# Patient Record
Sex: Female | Born: 1975 | Hispanic: Yes | Marital: Single
Health system: Southern US, Community
[De-identification: ages and names within clinical notes are randomized; demographics above are authoritative.]

## PROBLEM LIST (undated history)

## (undated) DIAGNOSIS — Z13220 Encounter for screening for lipoid disorders: Secondary | ICD-10-CM

## (undated) DIAGNOSIS — E0500 Thyrotoxicosis with diffuse goiter without thyrotoxic crisis or storm: Secondary | ICD-10-CM

## (undated) HISTORY — PX: BREAST BIOPSY: SHX20

---

## 2017-04-30 ENCOUNTER — Telehealth (HOSPITAL_COMMUNITY): Payer: Self-pay | Admitting: Family Medicine

## 2017-05-06 NOTE — Telephone Encounter (Signed)
Called and spoke to Ginger to inform her that I had reached out a few times to get patient scheduled for an echocardiogram per Dr. Johnny Bridge and I had not received a call back.

## 2018-05-20 ENCOUNTER — Other Ambulatory Visit: Payer: Self-pay | Admitting: Family Medicine

## 2018-05-20 ENCOUNTER — Other Ambulatory Visit: Payer: Self-pay | Admitting: Physician Assistant

## 2018-05-20 DIAGNOSIS — Z1231 Encounter for screening mammogram for malignant neoplasm of breast: Secondary | ICD-10-CM

## 2018-05-24 ENCOUNTER — Ambulatory Visit
Admission: RE | Admit: 2018-05-24 | Discharge: 2018-05-24 | Disposition: A | Discharge status: Home / Self Care | Payer: BLUE CROSS/BLUE SHIELD | Source: Ambulatory Visit | Attending: Family Medicine | Admitting: Family Medicine

## 2018-05-24 DIAGNOSIS — Z1231 Encounter for screening mammogram for malignant neoplasm of breast: Secondary | ICD-10-CM

## 2018-05-25 NOTE — Addendum Note (Signed)
Addended by: Morrell Riddle on: 05/25/2018 11:50 AM     Modules accepted: Orders      Electronically signed by Benny Lennert, PA at 05/25/2018 11:50 AM EDT

## 2018-05-25 NOTE — Progress Notes (Signed)
Formatting of this note is different from the original.  Images from the original note were not included.  Andrea Watkins    MRN: [id number] DOB: 21-Jan-1976    PCP: Benny Lennert, PA    Assessment and Plan :   1. Annual physical exam (Primary)  -     POCT CBC W/DIFF  -     Comprehensive Metabolic Panel; Future; Expected date: 06/01/2018  -     Iron and Total Iron-Binding Capacity (TIBC); Future; Expected date: 06/01/2018  -     Lipid Panel With LDL/HDL Ratio; Future; Expected date: 06/01/2018  -     POCT urinalysis dipstick  -     HIV 1/0/2 Ag/Ab 4th Generation Preliminary Test w/Rflx; Future  -     Chlamydia/GC Amplification Urine; Future  -     RPR; Future  -     RPR  -     Chlamydia/GC Amplification Urine  -     HIV 1/0/2 Ag/Ab 4th Generation Preliminary Test w/Rflx  -     Lipid Panel With LDL/HDL Ratio  -     Iron and Total Iron-Binding Capacity (TIBC)  -     Comprehensive Metabolic Panel  2. Leg cramping - trial of stretches and increase water while waiting on lab resullts  -     Comprehensive Metabolic Panel; Future; Expected date: 06/01/2018  -     Iron and Total Iron-Binding Capacity (TIBC); Future; Expected date: 06/01/2018  -     Iron and Total Iron-Binding Capacity (TIBC)  -     Comprehensive Metabolic Panel  3. Pica- check labs  -     POCT CBC W/DIFF  -     Iron and Total Iron-Binding Capacity (TIBC); Future; Expected date: 06/01/2018  -     Iron and Total Iron-Binding Capacity (TIBC)  4. Elevated LDL cholesterol level -  Check labs  -     Lipid Panel With LDL/HDL Ratio; Future; Expected date: 06/01/2018  -     Lipid Panel With LDL/HDL Ratio  5. MVP (mitral valve prolapse)  -     Ambulatory referral to Cardiology    Pt will make an appt with physcologist for ADD evaluation and evaluation of mental health issues.    Follow up if symptoms worsen or fail to improve, for CPE.      Patient's Medications        Accurate as of May 25, 2018  8:54 AM. Reflects encounter med changes as of last refresh          Continued Medications      Instructions   ESTARYLLA 0.25-35 mg-mcg per tablet  Generic drug:  norgestimate-ethinyl estradiol   1 tablet, Oral, Daily      Discontinued Medications    cetirizine-pseudoephedrine 5-120 MG per tablet  Commonly known as:  ZYRTEC-D  Stopped by:  Benny Lennert, PA    polyethylene glycol powder  Commonly known as:  MIRALAX,GAVILAX,CLEARLAX  Stopped by:  Benny Lennert, PA        Risks, benefits, and alternatives of the medications and treatment plan prescribed today were discussed, and patient expressed understanding. Plan follow-up as discussed or as needed if any worsening symptoms or change in condition.  Patient voiced understanding of the treatment plan and agreed to attempt to comply.    See after visit summary for patient specific instructions.    Subjective:     Chief Complaint   Patient presents with   ? Annual Exam  fasting     Pt presents to clinic for a CPE.    Cervical Cancer Screening:        Last pap -  04/2017 - normal       History of previous abnormal pap - no  Breast Cancer Screening:        Last mammogram yesterday       SBE none  Colorectal Cancer Screening: last year - normal    HIV Screening: 2018  STI Screening: 2018  Last dental exam: once a year  Last vision exam: never    Typical meals for patient: 3 meals - both home cooked/out to eat  Typical beverage choices: coffee  Exercises: 3 times per week for 1 hour  Sleeps: 5 hrs per night and sleeping well except when she has a lot going on in her life    Leg cramping and feet cramping over the last several weeks.  Se has also been craving ice - she has h/o anemia and iron def in the past.    She is also having some problems with her attention.  Does not affect her at work.  She finds that she has had trouble at home with starting multiple and finishing no tasks for a long time.  She has never been evaluated for attention problems.  She does also admit to have mental stressors in her life.    Continues to have some  abd cramping and bloating - she had a CT which showed some constipation that I reviewed while she was in the office. I reviewed her colonoscopy and endoscopy which were both normal.    Pt was obtained by patient.    Vaccinations:  Immunization History   Administered Date(s) Administered   ? INFLUENZA QUADRIVALENT (AGE 59 MONTHS & OLDER) 07/08/2016   ? Influenza Quad 3+ yrs 05/17/2018   ? Tdap 07/08/2016     Patient Active Problem List   Diagnosis   ? MVP (mitral valve prolapse)     Patient Care Team:  Benny Lennert, PA as PCP - General (Physician Assistant)  Marijo File, PA as Physician Assistant (Gastroenterology)  Konrad Penta, MD as Consulting Physician (Gastroenterology)    Review of Systems   Constitutional: Negative.    HENT: Negative.    Eyes: Negative.    Respiratory: Negative.    Cardiovascular: Negative.    Gastrointestinal: Negative.    Endocrine: Negative.    Genitourinary: Negative.    Musculoskeletal: Negative.         Leg cramping and feet cramping   Skin: Negative.    Allergic/Immunologic: Negative.    Neurological: Negative.    Hematological: Negative.    Psychiatric/Behavioral: Negative.          Patient's Medications        Accurate as of May 25, 2018  8:54 AM. 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 No pertinent surgical history.    Family History   Problem Relation Age of Onset   ? Hypertension Mother    ? Diabetes Mother    ? Peripheral vascular disease Mother    ? Stroke Father    ? Hypertension Father    ? Diabetes Sister    ? Thyroid disease Sister    ? Hypertension Sister    ? Asthma Sister    ? Splenomegaly Sister    ? Cirrhosis Sister    ? Alcohol abuse Sister    ? Substance abuse Sister    ? No Known Problems Brother    ? Cirrhosis Brother    ? Diabetes Brother    ? Alcohol abuse Brother      Objective:   BP 122/80   Pulse 83   Resp 16   Ht 4\' 11"  (1.499 m)   Wt 124 lb 6.4 oz (56.4 kg)   LMP 05/23/2018   SpO2 99%   Breastfeeding? No   BMI 25.13 kg/m     Wt Readings from Last 3 Encounters:   05/25/18 124 lb 6.4 oz (56.4 kg)   04/30/17 120 lb 12.8 oz (54.8 kg)   11/21/16 122 lb (55.3 kg)     No exam data present    Physical Exam  Vitals signs and nursing note reviewed. Exam conducted with a chaperone present.   Constitutional:       Appearance: Normal appearance.   HENT:      Head: Normocephalic and atraumatic.      Right Ear: Tympanic membrane, ear canal and external ear normal. There is no impacted cerumen.      Left Ear: Tympanic membrane, ear canal and external ear normal. There is no impacted cerumen.      Nose: Nose normal. No congestion or rhinorrhea.      Mouth/Throat:      Mouth: Mucous membranes are moist.      Pharynx: Oropharynx is clear. No oropharyngeal exudate or posterior oropharyngeal erythema.   Eyes:      Extraocular Movements: Extraocular movements intact.      Conjunctiva/sclera: Conjunctivae normal.      Pupils: Pupils are equal, round, and reactive to light.   Neck:      Musculoskeletal: Normal range of motion and neck supple.       Thyroid: No thyroid mass or thyromegaly.      Trachea: Trachea and phonation normal.   Cardiovascular:      Rate and Rhythm: Normal rate and regular rhythm.      Heart sounds: Normal heart sounds. No murmur.   Pulmonary:      Effort: Pulmonary effort is normal.      Breath sounds: Normal breath sounds.   Abdominal:      General: Abdomen is flat.  Bowel sounds are normal.      Palpations: Abdomen is soft.   Musculoskeletal: Normal range of motion.   Lymphadenopathy:      Cervical:      Right cervical: No superficial or posterior cervical adenopathy.     Left cervical: No superficial or posterior cervical adenopathy.   Skin:     General: Skin is warm and dry.      Capillary Refill: Capillary refill takes less than 2 seconds.   Neurological:      General: No focal deficit present.      Mental Status: She is alert and oriented to person, place, and time.      Gait: Gait normal.   Psychiatric:         Mood and Affect: Mood normal.         Behavior: Behavior normal.         Thought Content: Thought content normal.         Judgement: Judgment normal.     Benny Lennert PA-C     Benny Lennert, PA    [facility]  [facility]      Electronically signed by Benny Lennert, PA at 05/25/2018  9:00 AM EDT

## 2018-05-25 NOTE — Addendum Note (Signed)
Addended by: Avel Peace on: 05/25/2018 11:56 AM     Modules accepted: Orders      Electronically signed by Avel Peace at 05/25/2018 11:56 AM EDT

## 2019-02-24 IMAGING — MG DIGITAL SCREENING BILATERAL MAMMOGRAM WITH TOMO AND CAD
8 series · 8 of 24 positions shown · non-contrast
Comparison: Previous exam(s).

CLINICAL DATA: Screening.

EXAM:
DIGITAL SCREENING BILATERAL MAMMOGRAM WITH TOMO AND CAD

[R MLO synth-2D]
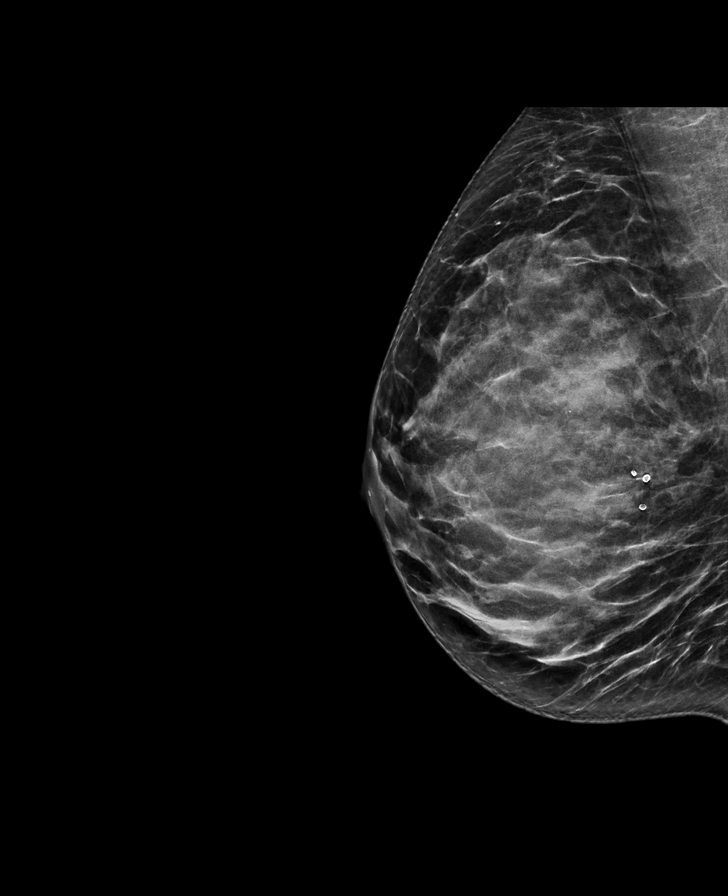

[L MLO synth-2D]
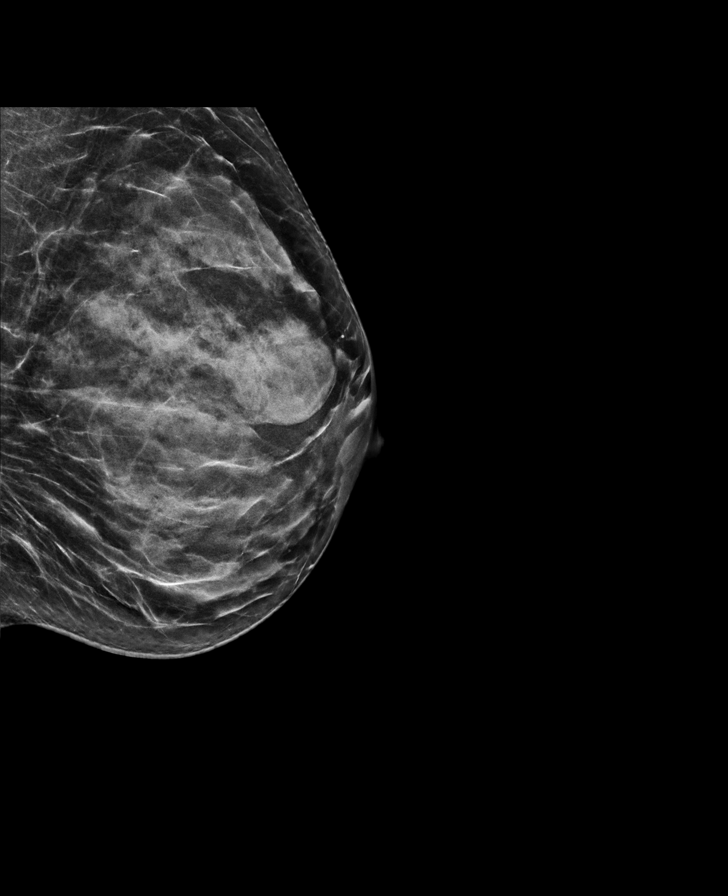

[R CC synth-2D]
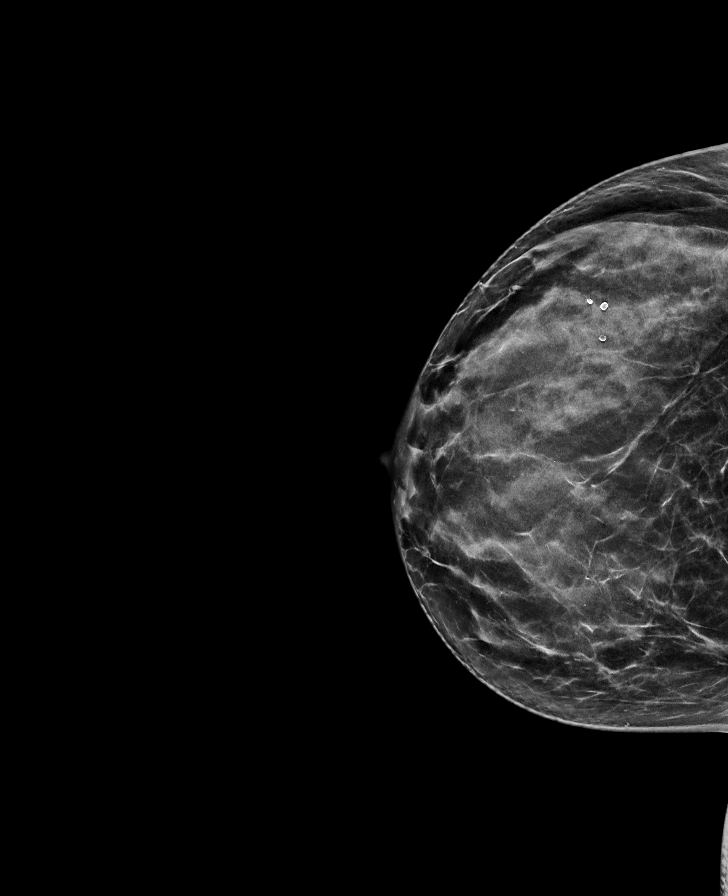

[L CC synth-2D]
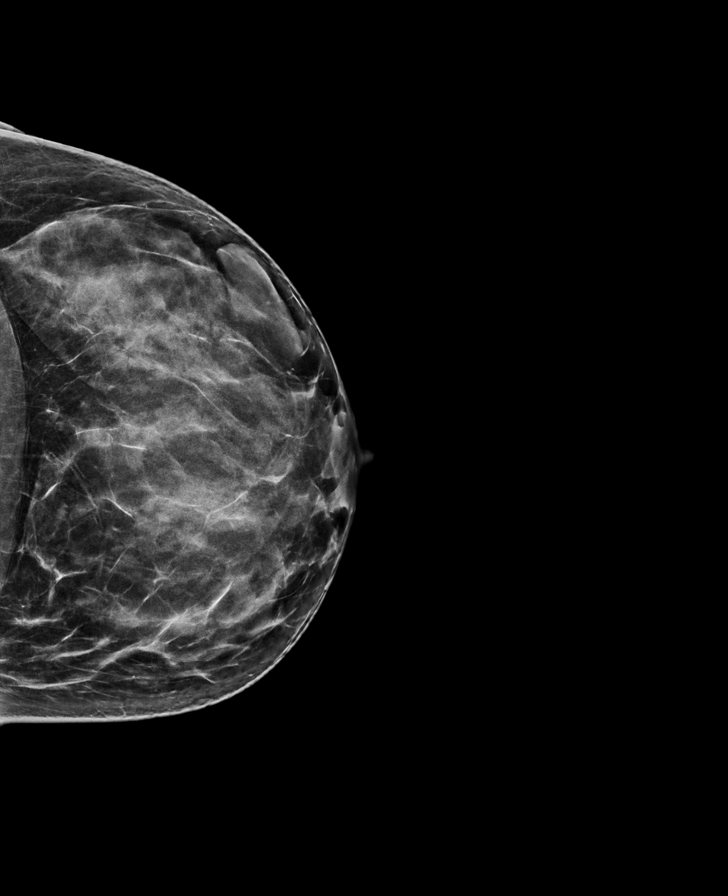

[R MLO tomo · tomo slice 31/60.0]
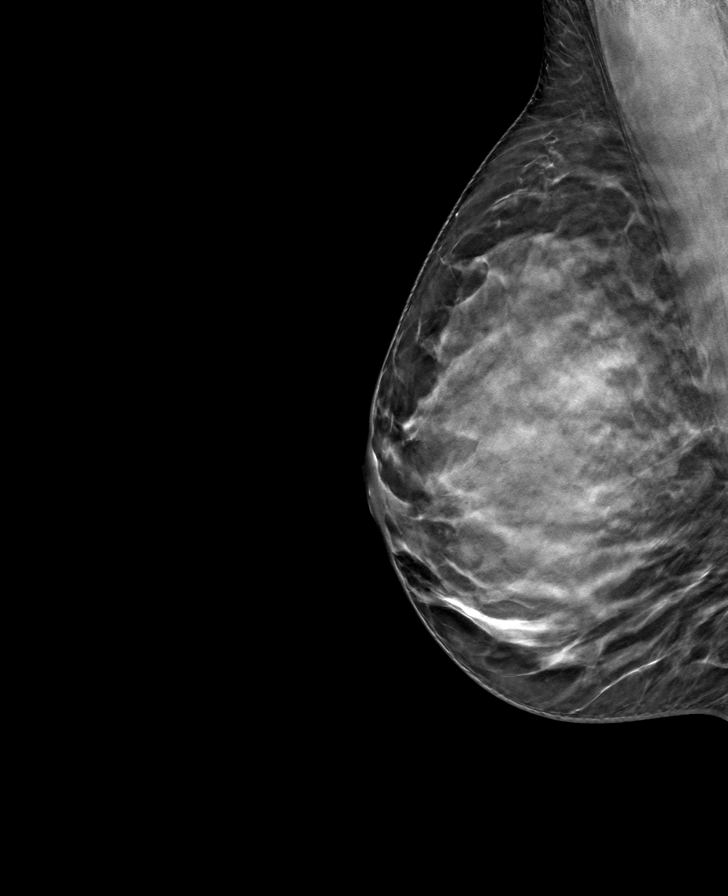

[R CC tomo · tomo slice 32/63.0]
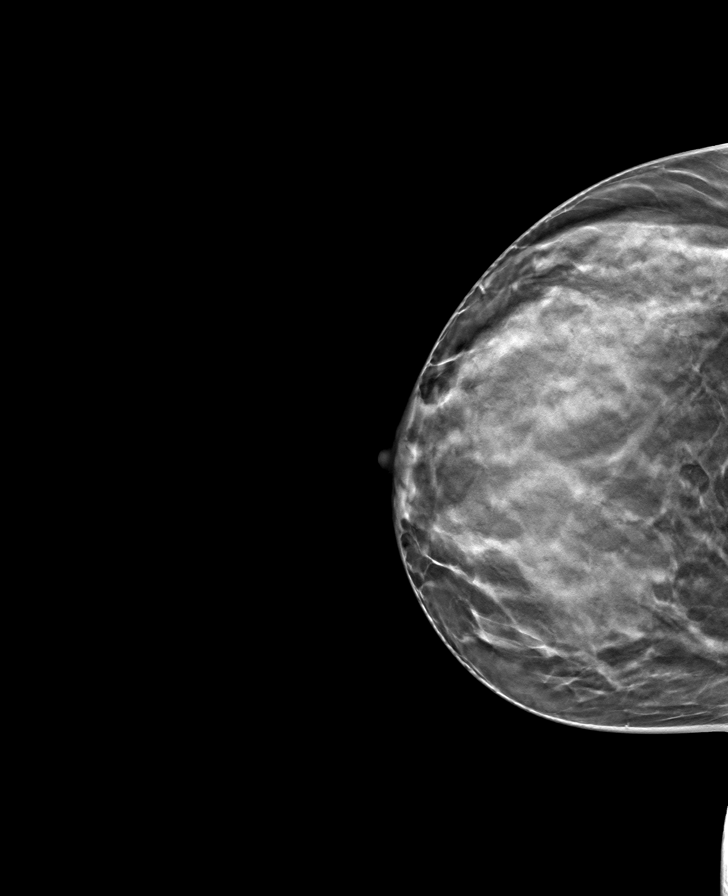

[L CC tomo · tomo slice 33/65.0]
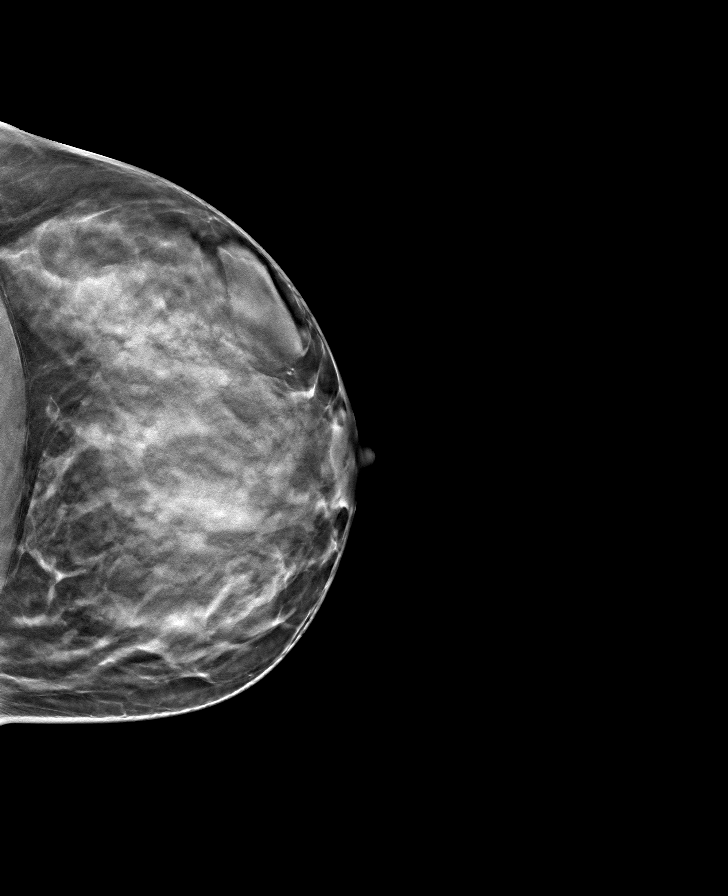

[L MLO tomo · tomo slice 31/60.0]
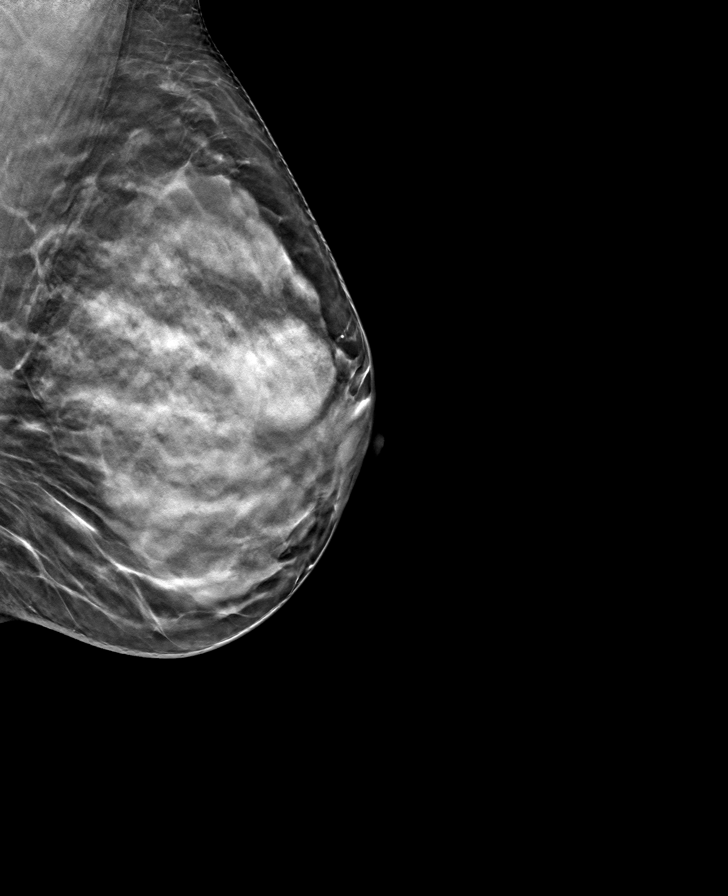

[8 of 24 positions shown; findings below may reference images not displayed]

ACR Breast Density Category d: The breast tissue is extremely dense,
which lowers the sensitivity of mammography.
FINDINGS: There are no findings suspicious for malignancy. Images were
processed with CAD.
IMPRESSION: No mammographic evidence of malignancy. A result letter of this
screening mammogram will be mailed directly to the patient.

RECOMMENDATION:
Screening mammogram in one year. (Code:RA-I-AVB)

BI-RADS CATEGORY  1: Negative.

## 2023-01-14 ENCOUNTER — Ambulatory Visit: Admit: 2023-01-14 | Discharge: 2023-01-14 | Payer: PRIVATE HEALTH INSURANCE | Attending: Family

## 2023-01-14 ENCOUNTER — Ambulatory Visit: Admit: 2023-01-14 | Payer: PRIVATE HEALTH INSURANCE

## 2023-01-14 DIAGNOSIS — S8992XA Unspecified injury of left lower leg, initial encounter: Secondary | ICD-10-CM

## 2023-01-14 DIAGNOSIS — S8002XA Contusion of left knee, initial encounter: Secondary | ICD-10-CM

## 2023-01-14 NOTE — Progress Notes (Signed)
Subjective:      Patient ID: Andrea Watkins     Chief Complaint Knee Injury (Happened yesterday. Hit knee on corner of desk.)       Time Patient seen by Provider: 1:47 PM  DOI 01/13/23  [facility]  Job: business patient specialist      HPI The patient is a 47 y.o. female presents with complaints of pain to the anterior aspect of the left knee over the patella and tibial plateau.  Patient states that she injured her knee yesterday while at work.  Patient states that she was seated in a chair and was scooting forward and hit her knee on a wooden desk.  Patient states that ambulating exacerbates the pain.  Patient applied ice twice last night, but felt that this made the pain worse.      Review of Worker's Compensation paperwork that the patient completed prior to visit.  Inconsistency noted with what the patient documented on the paperwork and what was said during history and physical.  Patient documented on paper that she hit her left knee as she was going to sit down in a chair.  She did in fact tell me that she was already seated in the chair and scooting forward on carpet and as a result the chair flew forward at a rapid pace and she hit her knee on the wooden desk.  Patient also documented on paper she could not bend her left knee all the way.  On exam, the patient had full range of motion without difficulty.        Review of Systems: As noted in the HPI    Past Medical History:   Diagnosis Date    Graves disease         History reviewed. No pertinent surgical history.     No Known Allergies     No current outpatient medications on file.     No current facility-administered medications for this visit.        BP 126/80 (Site: Right Upper Arm, Position: Sitting, Cuff Size: Medium Adult)   Pulse 89   Temp 98.1 F (36.7 C) (Oral)   Resp 16   Ht 1.499 m (4\' 11" )   Wt 59.8 kg (131 lb 12.8 oz)   SpO2 97%   BMI 26.62 kg/m       Objective:   Physical Exam  Vitals and nursing note  reviewed.   Constitutional:       General: She is not in acute distress.     Appearance: Normal appearance. She is not ill-appearing, toxic-appearing or diaphoretic.   HENT:      Head: Normocephalic and atraumatic.   Eyes:      Extraocular Movements: Extraocular movements intact.      Conjunctiva/sclera: Conjunctivae normal.   Pulmonary:      Effort: Pulmonary effort is normal.   Musculoskeletal:      Cervical back: Normal range of motion and neck supple.      Left knee: Bony tenderness (Tender to palpation over the left patella and tibial plateau) present. No swelling, deformity, effusion, erythema, ecchymosis, lacerations or crepitus. Normal range of motion. Tenderness present over the lateral joint line. No patellar tendon tenderness. No LCL laxity, MCL laxity, ACL laxity or PCL laxity.Normal alignment and normal patellar mobility. Normal pulse.   Neurological:      General: No focal deficit present.      Mental Status: She is alert and oriented to person, place, and  time.   Psychiatric:         Mood and Affect: Mood normal.         Behavior: Behavior normal.         Thought Content: Thought content normal.         Judgment: Judgment normal.           Assessment:   1. Contusion of left knee, initial encounter  2. Injury of left knee, initial encounter  -     XR KNEE LEFT (3 VIEWS); Future       Plan:   No results found for any visits on 01/14/23.   X-ray of left knee does not show any evidence of acute bony abnormality.  Advised the patient to apply ice for 15 to 20-minute increments, 3-4 times daily.  Elevate the affected extremity above the level of the heart frequently throughout the day.  May take over-the-counter NSAIDs per package instructions.  Patient placed on sedentary duties while at work.  Advised to return on 01/21/2023 for reevaluation.  Expect discharge at that time.      Vernelle Emerald, APRN - NP    I have reviewed prior visit notes and lab results pertinent to this visit. Point of care  results and imaging were independently interpreted by myself and discussed with the patient. Educated the patient on disease process, expected course of illness, and management. Educated the patient on how to administer prescribed medications and possible side effects.  Patient instructed to follow-up immediately for any new or worsening symptoms.  Patient verbalized understanding and agreement with plan of care.

## 2023-01-21 ENCOUNTER — Ambulatory Visit: Admit: 2023-01-21 | Discharge: 2023-01-21 | Payer: PRIVATE HEALTH INSURANCE | Attending: Family

## 2023-01-21 DIAGNOSIS — S8002XD Contusion of left knee, subsequent encounter: Secondary | ICD-10-CM

## 2023-01-21 NOTE — Progress Notes (Signed)
CHIEF COMPLAINT:  Chief Complaint   Patient presents with    Knee Pain     FOLLOW UP FROM 6/12, LEFT KNEE         HISTORY OF PRESENT ILLNESS:    Patient in office today for follow-up on pain of left knee that occurred at work.  Patient was seen on 01/14/2023 in our office.  X-ray of knee did not show any acute findings.  Patient diagnosed with hematoma of knee.  Patient still has pain but area is improving.  Patient is able to walk on knee and has normal range of motion, which is slow due to pain.  No swelling.  No giving out or locking of knee.  No numbness or weakness.  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      CURRENT MEDICATION LIST:    Current Outpatient Medications   Medication Sig Dispense Refill    methIMAzole (TAPAZOLE) 5 MG tablet TAKE 1/2 TABLET 3 TIMES WEEKLY       No current facility-administered medications for this visit.        ALLERGIES:    No Known Allergies     HISTORY:  Past Medical History:   Diagnosis Date    Graves disease       History reviewed. No pertinent surgical history.   Social History     Socioeconomic History    Marital status: Married     Spouse name: Not on file    Number of children: Not on file    Years of education: Not on file    Highest education level: Not on file   Occupational History    Not on file   Tobacco Use    Smoking status: Never    Smokeless tobacco: Never   Vaping Use    Vaping Use: Never used   Substance and Sexual Activity    Alcohol use: Never    Drug use: Never    Sexual activity: Not on file   Other Topics Concern    Not on file   Social History Narrative    Not on file     Social Determinants of Health     Financial Resource Strain: Not on file   Food Insecurity: Not on file   Transportation Needs: Not on file   Physical Activity: Not on file   Stress: Not on file   Social Connections: Not on file   Intimate Partner Violence: Not on file   Housing Stability: Not on file      History reviewed. No  pertinent family history.     REVIEW OF SYSTEMS:  Review of Systems   Constitutional:  Negative for chills and fever.   Respiratory:  Negative for shortness of breath.    Cardiovascular:  Negative for chest pain.   Neurological:  Negative for dizziness and syncope.        PHYSICAL EXAM:  Physical Exam  Constitutional:       General: She is not in acute distress.     Appearance: Normal appearance. She is not ill-appearing, toxic-appearing or diaphoretic.   HENT:      Head: Normocephalic and atraumatic.   Eyes:      Comments: Eyes grossly normal in appearance   Cardiovascular:      Rate and Rhythm: Normal rate and regular rhythm.   Pulmonary:      Effort: Pulmonary effort is normal.      Breath  sounds: Normal breath sounds.   Musculoskeletal:      Right lower leg: No edema.      Left lower leg: No edema.      Comments: Pain with palpation over patella/anterior knee with palpation.  Normal range of motion of knee, but slow due to pain at extremes of range of motion.  No significant swelling or redness.  Knee joint stable.  Normal sensation motor and circulation of leg   Skin:     General: Skin is warm and dry.      Comments:      Neurological:      General: No focal deficit present.      Mental Status: She is alert and oriented to person, place, and time.   Psychiatric:         Mood and Affect: Mood normal.         Behavior: Behavior normal.         Thought Content: Thought content normal.         Judgment: Judgment normal.        Vital Signs -   Visit Vitals  BP 116/74   Pulse 74   Temp 98.3 F (36.8 C)   Resp 16   Ht 1.499 m (4\' 11" )   Wt 59.9 kg (132 lb)   SpO2 96%   BMI 26.66 kg/m            LABS  No results found for this visit on 01/21/23.    TREATMENT:    1. Contusion of left knee, subsequent encounter  2. Acute pain of left knee  3. Encounter related to worker's compensation claim       EDUCATION:   Patient Instructions     -- Follow-up if symptoms do not continue to improve or anytime if symptoms worsen  --  May use over-the-counter Tylenol and or NSAIDs as needed for pain    --Patient may resume normal work activities  --Teacher, adult education. paperwork completed and given to patient before departure.  Copies made to be scanned into the chart.        Follow up and Dispositions:  Return if symptoms worsen or fail to improve.       Wanita Chamberlain, APRN - NP

## 2023-01-21 NOTE — Patient Instructions (Addendum)
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  Copies made to be scanned into the chart.

## 2023-02-19 ENCOUNTER — Ambulatory Visit: Admit: 2023-02-19 | Discharge: 2023-02-19 | Payer: PRIVATE HEALTH INSURANCE

## 2023-02-19 ENCOUNTER — Ambulatory Visit: Admit: 2023-02-19 | Discharge: 2023-02-19 | Payer: PRIVATE HEALTH INSURANCE | Attending: Physician Assistant

## 2023-02-19 DIAGNOSIS — M25562 Pain in left knee: Secondary | ICD-10-CM

## 2023-02-19 NOTE — Progress Notes (Signed)
Andrea Watkins (DOB:  [birth year]) is a 47 y.o. female,Established patient, here for evaluation of the following chief complaint(s):  Knee Pain (Here for follow up on knee ,  still not able to bend it and limping when walking pressure  tender to touch in the patella area of the knee)      Assessment & Plan :  Visit Diagnoses and Associated Orders       Left knee pain, unspecified chronicity    -  Primary    XR KNEE LEFT (3 VIEWS) [16109 CPT(R)]   - Future Order    [facility] - Erik Obey MD, Orthopaedics [address] [phone] Custom]           Work related injury                        Subjective :  47 YO FEMALE PRESENT TO [facility] S/P WORK RELATED INJURY ON 01/13/23.  AT HER PLACE OF WORK, SHE STRUCK ANTERIOR PORTION OF LEFT KNEE ON A CORNER OF A DESK.  SHE WAS EVALUATED IN THIS CLINIC INITIALLY AND THEN A WEEK LATER DUE TO SYMPTOMS WHICH SEEM TO BE IMPROVING.  UNFORTUNATELY WHEN SHE RETURNED TO FULL DUTY, KNEE PAIN, SWELLING AND STIFFNESS RETURNED. INTERIM. NO NEW INJURY.    NO OTHER TREATMENTS OR VISITS FOR SAME LEFT KNEE PAIN.      Knee Pain            Vitals:    02/19/23 1456   BP: 127/77   Pulse: 73   Resp: 16   Temp: 98.7 F (37.1 C)   SpO2: 97%   Weight: 61.2 kg (135 lb)   Height: 1.499 m (4\' 11" )       No results found for this visit on 02/19/23.      Objective   Physical Exam  Constitutional:       General: She is not in acute distress.     Appearance: She is normal weight. She is not ill-appearing or toxic-appearing.   Cardiovascular:      Rate and Rhythm: Normal rate and regular rhythm.      Pulses: Normal pulses.   Pulmonary:      Effort: Pulmonary effort is normal.   Musculoskeletal:      Left knee: Swelling present. No deformity, effusion, erythema, ecchymosis or lacerations. Normal range of motion. Tenderness (PATELLAR TTP AND TIBIAL PLATEAU TTP) present over the patellar tendon. No medial joint line, lateral joint line, MCL, LCL or ACL tenderness.      Instability Tests: Anterior drawer test  negative. Anterior Lachman test negative. Medial McMurray test negative and lateral McMurray test negative.   Skin:     General: Skin is warm and dry.      Findings: No bruising or erythema.   Neurological:      Mental Status: She is alert.     NEW XR TODAY, NO EVIDENCE OF ACUTE BONY INJURY, FX OR PRESENCE OF CALLUS FORMATION  DUE TO PERSISTENT NATURE OF SYMPTOMS, RECOMMEND ORTHO EVALUATION, CONSIDERATION FOR PT.      RECOMMEND GENTLE ROM NOW  CONSIDER OTC NSAIDS PER PKG INSTRUCTION FOR PAIN  ICE TWICE A DAY FOR 20 MINUTES          An electronic signature was used to authenticate this note.    Joelene Millin, PA-C

## 2023-02-19 NOTE — Patient Instructions (Signed)
NEW XR TODAY, NO EVIDENCE OF ACUTE BONY INJURY, FX OR PRESENCE OF CALLUS FORMATION  DUE TO PERSISTENT NATURE OF SYMPTOMS, RECOMMEND ORTHO EVALUATION, CONSIDERATION FOR PT.      RECOMMEND GENTLE ROM NOW  CONSIDER OTC NSAIDS PER PKG INSTRUCTION FOR PAIN  ICE TWICE A DAY FOR 20 MINUTES

## 2023-03-04 ENCOUNTER — Encounter: Payer: PRIVATE HEALTH INSURANCE | Attending: Student in an Organized Health Care Education/Training Program

## 2023-03-04 NOTE — Telephone Encounter (Signed)
The patient would like to schdl an appt for  - New patient  _  lt knee pain  /  no prev tx /  x ray in epic /  W/C (DOI 01/13/23 ) .      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

## 2023-03-12 ENCOUNTER — Encounter
Admit: 2023-03-12 | Discharge: 2023-03-12 | Payer: PRIVATE HEALTH INSURANCE | Attending: Student in an Organized Health Care Education/Training Program

## 2023-03-12 DIAGNOSIS — M25562 Pain in left knee: Secondary | ICD-10-CM

## 2023-03-12 NOTE — Progress Notes (Signed)
Dorthula Rue. Katrinka Blazing, MD  Primary Care Sports Medicine Physician  Sports Medicine  Phone [phone]  Fax [phone]    [address]   [address]  [place], [place]  [address]  [place], [place]  [address]    03/12/2023       Name: Andrea Watkins  Age:  47 y.o.  Sex:  female  DOB:  [birth year]     MRN:  [id number]      Chief Complaint:     Knee Pain (Lt knee pain x 2 months )       History of Present Illness:     Andrea Watkins  is a pleasant 47 y.o. female who presents today for evaluation of her  Left knee pain. Patient states that their pain began after hitting her anterior left knee at work on January 13 2023 while at work on a desk. She felt immediate pain and was [facility]. She had Xrays which were negative, but she has continued to have pain since. Given continued pain, she was referred to Ortho. Patient localizes their pain over the anterior aspect of her Left knee without radiation. She describes her pain as constant in nature. Her pain is worse with weightbearing, bending/squatting, and kneeling. She reports trying OTC medications with some relief.  Patient would like to discuss further treatment options of her Left knee pain today.    Past Medical History:         Diagnosis Date    Graves disease       Past Surgical History:   History reviewed. No pertinent surgical history.   Family History:     Family History   Problem Relation Age of Onset    Cancer Mother     Diabetes Mother     Osteoporosis Mother     Osteoporosis Maternal Grandmother         Deceased      Social History:     Social History     Occupational History    Not on file   Tobacco Use    Smoking status: Never    Smokeless tobacco: Never   Vaping Use    Vaping Use: Never used   Substance and Sexual Activity    Alcohol use: Not Currently     Comment: Events ususally    Drug use: Never    Sexual activity: Yes     Partners: Male     Comment: Husband      Allergies:   No Known Allergies   Medications:      No outpatient medications have been marked as taking for the 03/12/23 encounter (Office Visit) with Erik Obey, MD.      Physical Examination:     General: Well-appearing female in no apparent distress.     HEENT:  Atraumatic, normocephalic.     LEFT KNEE PHYSICAL EXAMINATION:     Observation  Wound/Incision:    None  Abrasion:     negative  Inspection:     No Erythema, No Ecchymosis, No Muscle Atrophy, No Warmth, No Deformity  Swelling:     None  Effusion:     None  Prepatellar Bursitis   Negative  Alignment:     Normal  Quadricept Atrophy    Negative    Patella Assessment:  Patella Apprehension:   Positive  Patella Tracking:    Normal  Patella Position:    Normal  Q-Angle:     ---  Palpation:  Global Knee Tenderness  Negative  Patella Compression Test  Positive  Tibial Tubercle    Negative  Medial Plica    Negative  Medial Retinaculum   Positive  Lateral Retinaculum   Positive  IT Band    Negative  Pes Anserinus    Negative  MCL Tenderness   Negative  LCL Tenderness   Negative  Medial Joint Line Tenderness  Negative  Lateral Joint Line Tenderness  Negative  Quadricep    Negative, Negative Defect  Patella Tendon   Negative, Negative Defect  Baker Cyst    negative    Range of motion  Extension    0  Flexion     120  Crepitus    Patellofemoral    Strength:  Extension    5 / 5  Flexion     5 / 5    Special tests:  Lachmann's    Stable  Anterior Drawer   Stable  Posterior Drawer   Stable  Pivot Shift    Negative  Dial Test    Negative  Varus at 0 Deg   Stable  Varus at 30 Deg   Stable  Valgus at 0 Deg   Stable  Valgus at 30 Deg   Stable  Medial McMurray's   Negative  Lateral McMurray's   Negative    Neurovascular exam:  2+ DP and PT Pulses with Brisk capillary refill  Sensation intact light touch distally  Imaging:     Left knee XR:  No fracture or periosteal reaction. No joint effusion. Normal joint   space with no reactive degenerative change. No focal lytic or sclerotic lesion.   No evident soft tissue  abnormality.    Assessment & Plan:   Andrea Watkins was seen today for knee pain.    Diagnoses and all orders for this visit:    Chronic pain of left knee  -     MRI KNEE LEFT WO CONTRAST; Future  -     [facility] - [facility] - Alveria Apley, [address]    Patellar instability of left knee          47 year old female here for left anterior knee pain that began a desk on January 13, 2023 while at work.  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  She states that since her injury she has continued to have left anterior knee pain and noted swelling last week.  Her pain continues to cause her to limp.  Her exam is notable for tenderness to palpation over her medial and lateral patellar retinaculum with positive talar grind test and patellar apprehension.  Patient denies frank dislocation but may have had a subluxation of her patella.  We also discussed that she may have an osteochondral defect due to continued pain despite reassuring x-rays.  Reviewed x-rays of left knee with patient in the exam room.  Recommended MRI of left knee to further evaluate for retropatellar osteochondral defect.  Placed MRI order.  Provided patient with home exercise program for quadricep strengthening and hip abductor strengthening.  Provided PT referral.  Recommended avoiding high-impact exercise.  Patient will follow-up with me after MRI is complete.  She will continue to take oral anti-inflammatories as needed.      Provided copies of referrals to patient and home exercise program handout.    Provided patient with work restriction letter.    Patient verbalized understanding and is in agreement with management plan.  Procedure(s):  None       Disclaimer: This note was dictated using speech recognition. Minor errors in transcription may be present. Please contact creator for further clarification.

## 2023-03-23 NOTE — Telephone Encounter (Signed)
Patient had MRI at [facility] and needs to make sure you have access to [facility] for her appt tomorrow. Thank you

## 2023-03-23 NOTE — Telephone Encounter (Signed)
Patient called would like to set up film review with Dr. Katrinka Blazing this is a Work Occupational hygienist. Please assist.

## 2023-03-24 ENCOUNTER — Ambulatory Visit
Admit: 2023-03-24 | Discharge: 2023-03-24 | Payer: PRIVATE HEALTH INSURANCE | Attending: Student in an Organized Health Care Education/Training Program

## 2023-03-24 DIAGNOSIS — M25562 Pain in left knee: Secondary | ICD-10-CM

## 2023-03-24 NOTE — Progress Notes (Signed)
Dorthula Rue. Katrinka Blazing, MD  Phone [phone]  Fax [phone]    [address] [facility]  [address]  [place], [place]  [address]  [place], [place]  [address]         [facility]  [facility] [address]     Date of Encounter:  03/24/2023     PATIENT INFORMATION:   Name: Andrea Watkins  Age:  47 y.o.  Sex:  female  DOB:  Dec 14, 1975     MRN:  [id number]      Allergies:   No Known Allergies     Medications:     No outpatient medications have been marked as taking for the 03/24/23 encounter (Office Visit) with Erik Obey, MD.         Chief Complaint:     Knee Injury (Left knee)       History of Present Illness:     47 year old female here for follow-up of left anterior knee pain that developed while at work on January 13, 2023 when she hit the anterior aspect of her left knee on her desk.  She reports interval improvement in her pain since our last visit in August 2 weeks ago.  She had an MRI performed recently and would like to review results.  She has been taking Aleve as needed for her pain.  She reports intermittent anterior knee swelling.  No locking or catching symptoms reported.      Physical Examination:       General: Well-appearing female in no apparent distress.      HEENT:  Atraumatic, normocephalic.      LEFT KNEE PHYSICAL EXAMINATION:      Observation  Wound/Incision:                                   None  Abrasion:                                             negative  Inspection:                                           No Erythema, No Ecchymosis, No Muscle Atrophy, No Warmth, No Deformity  Swelling:                                              None  Effusion:                                              None  Prepatellar Bursitis                              Negative  Alignment:  Normal  Quadricept Atrophy                             Negative     Patella Assessment:  Patella Apprehension:                         Mildly  Positive  Patella Tracking:                                 Normal  Patella Position:                                  Normal  Q-Angle:                                              ---     Palpation:  Global Knee Tenderness                    Negative  Patella Compression Test                  Mildly Positive  Tibial Tubercle                                    Negative  Medial Plica                                         Negative  Medial Retinaculum                            Positive  Lateral Retinaculum                            Negative  IT Band                                               Negative  Pes Anserinus                                     Negative  MCL Tenderness                                Negative  LCL Tenderness                                 Negative  Medial Joint Line Tenderness             Negative  Lateral Joint Line Tenderness             Negative  Quadricep  Negative, Negative Defect  Patella Tendon                                    Negative, Negative Defect  Baker Cyst                                          negative     Range of motion  Extension                                            0  Flexion                                                 120  Crepitus                                               Patellofemoral     Strength:  Extension                                            5 / 5  Flexion                                                 5 / 5     Special tests:  Lachmann's                                         Stable  Anterior Drawer                                   Stable  Posterior Drawer                                 Stable  Pivot Shift                                            Negative  Dial Test                                              Negative  Varus at 0 Deg  Stable  Varus at 30 Deg                                  Stable  Valgus at 0 Deg                                   Stable  Valgus at 30 Deg                                Stable  Medial McMurray's                              Negative  Lateral McMurray's                             Negative     Neurovascular exam:  2+ DP and PT Pulses with Brisk capillary refill  Sensation intact light touch distally  Imaging:      Left knee XR:  No fracture or periosteal reaction. No joint effusion. Normal joint   space with no reactive degenerative change. No focal lytic or sclerotic lesion.   No evident soft tissue abnormality.    Left Knee MRI:             Assessment & Plan:     Andrea "[place]iann Hahn" was seen today for knee injury.    Diagnoses and all orders for this visit:    Chronic pain of left knee    Patellar instability of left knee         47 year old female here for follow-up of left anterior knee pain that began 2 months ago while at work when she had her left knee on a desk.  Her exam is notable for tenderness to palpation over the medial patellar retinaculum and mild patellar apprehension.  She had an MRI completed recently and is here today to review results.  We reviewed her MRI findings which were negative for ligamentous or meniscal injury.  Additionally she did not have any significant chondral defects.  Given reassuring imaging recommended conservative therapy focusing on patellofemoral strengthening.  Provided patellofemoral pain home exercise program.  Discussed that if she does ever have any true patellar dislocation or patellar instability, then we could consider referral to physical therapy, but she will stick with home exercise program for now.  She feels ready to return to full duty.  She has a physical therapy referral in place from her last visit and will set up if her pain is not improved.  Patient will follow-up with me as needed.    Completed work restriction note for patient releasing patient to full duty.    Provided home exercise program handout.    Patient will follow-up with me as needed.    Procedure(s):      None

## 2023-11-09 ENCOUNTER — Ambulatory Visit
Admit: 2023-11-09 | Discharge: 2023-11-09 | Payer: BLUE CROSS/BLUE SHIELD | Attending: Student in an Organized Health Care Education/Training Program | Primary: Student in an Organized Health Care Education/Training Program

## 2023-11-09 VITALS — BP 112/64 | HR 78 | Temp 97.80000°F | Resp 18 | Ht 59.0 in | Wt 140.1 lb

## 2023-11-09 DIAGNOSIS — E0500 Thyrotoxicosis with diffuse goiter without thyrotoxic crisis or storm: Secondary | ICD-10-CM

## 2023-11-09 MED ORDER — METHIMAZOLE 5 MG PO TABS
5 | ORAL_TABLET | ORAL | 2 refills | Status: AC
Start: 2023-11-09 — End: 2024-02-07

## 2023-11-09 NOTE — Assessment & Plan Note (Addendum)
 Chronic, at goal (stable), continue current plan pending work up below, medication adherence emphasized, and lifestyle modifications recommended  Refilled medication today  Will obtain labs  Endocrinology referral, ophthalmology referral  Orders:    [facility] - Leveda Anna, DO, Endocrinology, [facility] [address]    methIMAzole (TAPAZOLE) 5 MG tablet; Take 1 tablet by mouth three times a week    External Referral To Ophthalmology    TSH reflex to FT4; Future    Thyrotropin Receptor Antibody; Future    CBC with Auto Differential; Future    Comprehensive Metabolic Panel    Hemoglobin A1C; Future

## 2023-11-09 NOTE — Progress Notes (Signed)
 Andrea Watkins (DOB:  01-19-1976) is a 48 y.o. female,New patient, here for evaluation of the following chief complaint(s):  New Patient (Wants endo referral for graves)         Assessment & Plan  Encounter to establish care with new doctor    Established  Reviewed previous notes, labs  Reviewed previous labs with patient including A1c, lipid panel, CBC, CMP, interpretation provided by me       Graves disease   Chronic, at goal (stable), continue current plan pending work up below, medication adherence emphasized, and lifestyle modifications recommended  Refilled medication today  Will obtain labs  Endocrinology referral, ophthalmology referral  Orders:    [facility] - Leveda Anna, DO, Endocrinology, [facility] [address]    methIMAzole (TAPAZOLE) 5 MG tablet; Take 1 tablet by mouth three times a week    External Referral To Ophthalmology    TSH reflex to FT4; Future    Thyrotropin Receptor Antibody; Future    CBC with Auto Differential; Future    Comprehensive Metabolic Panel    Hemoglobin A1C; Future    Encounter for behavioral health screening  PHQ-9 reviewed with patient, at goal    Orders:    BEHAV ASSMT W/SCORE & DOCD/STAND INSTRUMENT    Breast cancer screening by mammogram    Ordered    Orders:    MAM TOMO DIGITAL SCREEN BILATERAL; Future    Encounter for screening for lipid disorder    Ordered    Orders:    Lipid Panel; Future      Return in about 3 months (around 02/08/2024).       Subjective   48 year old female presents clinic for establishment of care  Patient recently moved to [place] from [place]  Patient states she has extensive history of Graves' disease  In the past states that she was following endocrinologist  Requesting referral for endocrinology  Believes it has been about a year since she had any lab work done  Notes in the past she did have palpitations, no thyroid storm, no hospitalizations  With medication and diet states that she was able to control her symptoms  well  Denies any proptosis, Graves' eye disease  Has not established with ophthalmologist        Review of Systems   Constitutional: Negative.    HENT: Negative.     Respiratory: Negative.     Cardiovascular: Negative.    Gastrointestinal: Negative.    Endocrine: Negative.    Genitourinary: Negative.    Musculoskeletal: Negative.    Skin: Negative.    Allergic/Immunologic: Negative.    Neurological: Negative.    Psychiatric/Behavioral: Negative.            Objective   Physical Exam  Vitals and nursing note reviewed.   Constitutional:       General: She is not in acute distress.     Appearance: Normal appearance.   Eyes:      Extraocular Movements: Extraocular movements intact.      Conjunctiva/sclera: Conjunctivae normal.      Pupils: Pupils are equal, round, and reactive to light.   Cardiovascular:      Rate and Rhythm: Normal rate and regular rhythm.      Pulses: Normal pulses.      Heart sounds: Normal heart sounds.   Musculoskeletal:      Cervical back: Normal range of motion and neck supple.   Skin:     General: Skin is warm and dry.  Neurological:      Mental Status: She is alert and oriented to person, place, and time.   Psychiatric:         Mood and Affect: Mood normal.         Behavior: Behavior normal.                  An electronic signature was used to authenticate this note.    --Stephens November, MD

## 2023-11-19 ENCOUNTER — Encounter

## 2023-11-19 LAB — HEMOGLOBIN A1C
Estimated Avg Glucose: 117
Estimated Avg Glucose: 126
Hemoglobin A1C: 5.7 % (ref 4.0–6.0)

## 2023-11-19 LAB — CBC WITH AUTO DIFFERENTIAL
Basophils %: 0.6 % (ref 0.0–2.0)
Basophils Absolute: 0 10*3/uL (ref 0.0–0.2)
Eosinophils %: 2.8 % (ref 0.0–7.0)
Eosinophils Absolute: 0.1 10*3/uL (ref 0.0–0.5)
Hematocrit: 34.4 % (ref 34.0–47.0)
Hemoglobin: 11.5 g/dL (ref 11.5–15.7)
Immature Grans (Abs): 0.01 10*3/uL (ref 0.00–0.06)
Immature Granulocytes %: 0.2 % (ref 0.0–0.6)
Lymphocytes Absolute: 1.5 10*3/uL (ref 1.0–3.2)
Lymphocytes: 31.6 % (ref 15.0–45.0)
MCH: 29 pg (ref 27.0–34.5)
MCHC: 33.4 g/dL (ref 30.0–36.0)
MCV: 86.6 fL (ref 81.0–99.0)
MPV: 11.7 fL (ref 7.0–12.2)
Monocytes %: 8.5 % (ref 4.0–12.0)
Monocytes Absolute: 0.4 10*3/uL (ref 0.3–1.0)
NRBC Absolute: 0 10*3/uL (ref 0.000–0.012)
NRBC Automated: 0 % (ref 0.0–0.2)
Neutrophils %: 56.3 % (ref 42.0–74.0)
Neutrophils Absolute: 2.7 10*3/uL (ref 1.6–7.3)
Platelets: 276 10*3/uL (ref 140–440)
RBC: 3.97 x10e6/mcL (ref 3.60–5.20)
RDW: 13.6 % (ref 10.0–17.0)
WBC: 4.7 10*3/uL (ref 3.8–10.6)

## 2023-11-19 LAB — LIPID PANEL
Chol/HDL Ratio: 4.4 (ref 0.0–4.4)
Cholesterol, Total: 245 mg/dL — ABNORMAL HIGH (ref 100–200)
HDL: 56 mg/dL (ref >=50)
LDL Cholesterol: 166.8 mg/dL — ABNORMAL HIGH (ref 0.0–100.0)
LDL/HDL Ratio: 3
Triglycerides: 111 mg/dL (ref 0–149)
VLDL: 22.2 mg/dL (ref 5.0–40.0)

## 2023-11-19 LAB — TSH REFLEX TO FT4: TSH: 1.06 u[IU]/mL (ref 0.358–3.740)

## 2023-11-21 LAB — THYROTROPIN RECEPTOR ANTIBODY: Thyrotropin Receptor Ab: <1.1 IU/L (ref 0.00–1.75)

## 2023-11-24 LAB — COMPREHENSIVE METABOLIC PANEL
ALT: 13 U/L (ref 0–42)
AST: 18 U/L (ref 0–46)
Albumin/Globulin Ratio: 1.9 (ref 1.00–2.70)
Albumin: 4.6 g/dL (ref 3.5–5.2)
Alk Phosphatase: 59 U/L (ref 35–117)
Anion Gap: 11 mmol/L (ref 2–17)
BUN: 12 mg/dL (ref 6–20)
CO2: 24 mmol/L (ref 22–29)
Calcium: 9.4 mg/dL (ref 8.5–10.7)
Chloride: 106 mmol/L (ref 98–107)
Creatinine: 0.8 mg/dL (ref 0.5–1.0)
Est, Glom Filt Rate: 92 mL/min/1.73mÂ² (ref >=60)
Globulin: 2.4 g/dL (ref 1.9–4.4)
Glucose: 85 mg/dL (ref 70–99)
Osmolaliy Calculated: 280 mosm/kg (ref 270–287)
Potassium: 4.4 mmol/L (ref 3.5–5.3)
Sodium: 141 mmol/L (ref 135–145)
Total Bilirubin: 0.25 mg/dL (ref 0.00–1.20)
Total Protein: 7 g/dL (ref 5.7–8.3)

## 2023-11-27 ENCOUNTER — Ambulatory Visit
Admit: 2023-11-27 | Discharge: 2023-11-27 | Payer: BLUE CROSS/BLUE SHIELD | Attending: Family | Primary: Student in an Organized Health Care Education/Training Program

## 2023-11-27 VITALS — BP 101/70 | HR 99 | Temp 98.50000°F | Resp 16 | Ht 59.0 in | Wt 134.0 lb

## 2023-11-27 DIAGNOSIS — K529 Noninfective gastroenteritis and colitis, unspecified: Secondary | ICD-10-CM

## 2023-11-27 LAB — POCT COVID-19 ANTIGEN CARD
Lot Number: 922595
SARS-COV-2, POC: NOT DETECTED

## 2023-11-27 LAB — AMB POC INFLUENZA ASSAY W/OPTIC
Flu A Antigen: NEGATIVE
Flu B Antigen: NEGATIVE

## 2023-11-27 NOTE — Patient Instructions (Signed)
---  Suspect symptoms are due to viral gastroenteritis, stomach bug  ---Drink plenty water and advance diet as tolerated  ---May add diluted Gatorade and/or Pedialyte  --- May use Tylenol and/or NSAIDs, like ibuprofen or naproxen as needed for pain/fever  ---Follow-up next week if symptoms are not improving and go to ER if condition worsens    ---Gastroenteritis education printed    ---Excuse given for today.  Patient also given excuse for Monday, if needed

## 2023-11-27 NOTE — Progress Notes (Signed)
 CHIEF COMPLAINT:  Chief Complaint   Patient presents with    Diarrhea        HISTORY OF PRESENT ILLNESS:    Patient in office today for nausea vomiting diarrhea chills body aches and subjective fever.  Symptoms started on Tuesday.  Patient had nausea vomiting and diarrhea on Monday and Tuesday but resolved on Thursday.  Patient woke up this morning with some diarrhea.  No nausea or vomiting today.  Patient had abdominal cramping associated with symptoms which is since resolved.  Patient has had some mild ongoing runny nose and nasal congestion from environmental allergies.  No shortness of breath, chest pain dizziness or syncope.  No abnormal urinary symptoms.  No bloody or black stools.  No recent travels out of the [place] .  No recent camping.  No known sick contact      CURRENT MEDICATION LIST:    Current Outpatient Medications   Medication Sig Dispense Refill    methIMAzole  (TAPAZOLE ) 5 MG tablet Take 1 tablet by mouth three times a week 12 tablet 2     No current facility-administered medications for this visit.        ALLERGIES:    No Known Allergies     HISTORY:  Past Medical History:   Diagnosis Date    Graves disease     Hyperlipidemia       History reviewed. No pertinent surgical history.   Social History     Socioeconomic History    Marital status: Married     Spouse name: Not on file    Number of children: Not on file    Years of education: Not on file    Highest education level: Not on file   Occupational History    Not on file   Tobacco Use    Smoking status: Never    Smokeless tobacco: Never   Vaping Use    Vaping status: Never Used   Substance and Sexual Activity    Alcohol use: Not Currently     Comment: Events ususally    Drug use: Never    Sexual activity: Yes     Partners: Male     Comment: Husband   Other Topics Concern    Not on file   Social History Narrative    Not on file     Social Drivers of Health     Financial Resource Strain: Not on file   Food Insecurity: Not on file   Transportation Needs:  Not on file   Physical Activity: Not on file   Stress: Not on file   Social Connections: Not on file   Intimate Partner Violence: Not on file   Housing Stability: Not on file      Family History   Problem Relation Age of Onset    Cancer Mother         Eye cancer    Diabetes Mother     Osteoporosis Mother     Atrial Fibrillation Mother     Heart Disease Mother     High Blood Pressure Mother     High Cholesterol Mother     Stroke Mother     Osteoporosis Maternal Grandmother         Deceased    High Blood Pressure Maternal Grandmother     High Cholesterol Maternal Grandmother     Alcohol Abuse Sister         Deceased- Cirrhosis of the liver    Alcohol Abuse Brother  Deceased - Cirrhosis of the liver        REVIEW OF SYSTEMS:  Review of Systems   Constitutional:  Positive for chills and fever.   Respiratory:  Negative for shortness of breath.    Cardiovascular:  Negative for chest pain.   Neurological:  Negative for dizziness and syncope.        PHYSICAL EXAM:  Physical Exam  Constitutional:       General: She is not in acute distress.     Appearance: Normal appearance. She is not toxic-appearing or diaphoretic.   HENT:      Head: Normocephalic and atraumatic.      Right Ear: Ear canal and external ear normal.      Left Ear: Tympanic membrane, ear canal and external ear normal.      Nose: Nose normal.      Mouth/Throat:      Pharynx: Oropharynx is clear.   Eyes:      Conjunctiva/sclera: Conjunctivae normal.   Cardiovascular:      Rate and Rhythm: Normal rate and regular rhythm.   Pulmonary:      Effort: Pulmonary effort is normal.      Breath sounds: Normal breath sounds.   Abdominal:      General: There is no distension.      Palpations: Abdomen is soft. There is no mass.      Tenderness: There is no abdominal tenderness. There is no right CVA tenderness, left CVA tenderness, guarding or rebound.   Musculoskeletal:         General: Normal range of motion.      Cervical back: Normal range of motion and neck  supple. No rigidity or tenderness.   Lymphadenopathy:      Cervical: No cervical adenopathy.   Skin:     General: Skin is warm and dry.   Neurological:      General: No focal deficit present.      Mental Status: She is alert and oriented to person, place, and time.      Sensory: No sensory deficit.      Motor: No weakness.      Coordination: Coordination normal.      Gait: Gait normal.   Psychiatric:         Mood and Affect: Mood normal.         Behavior: Behavior normal.         Thought Content: Thought content normal.         Judgment: Judgment normal.        Vital Signs -   Visit Vitals  BP 101/70   Pulse 99   Temp 98.5 F (36.9 C) (Oral)   Resp 16   Ht 1.499 m (4\' 11" )   Wt 60.8 kg (134 lb)   SpO2 99%   BMI 27.06 kg/m            LABS  Results for orders placed or performed in visit on 11/27/23   AMB POC INFLUENZA ASSAY W/OPTIC   Result Value Ref Range    Valid Internal Control, POC pass     Flu A Antigen NEG     Flu B Antigen NEG    POCT COVID-19 Antigen Card   Result Value Ref Range    SARS-COV-2, POC Not-Detected Not Detected    Lot Number [id number]     QC Pass/Fail pass          TREATMENT:    1. Gastroenteritis  2. Chill  -  AMB POC INFLUENZA ASSAY W/OPTIC  -     POCT COVID-19 Antigen Card  3. Diarrhea, unspecified type  4. Nausea and vomiting, unspecified vomiting type  5. Abdominal cramping  6. Subjective fever       EDUCATION:   Patient Instructions     ---Suspect symptoms are due to viral gastroenteritis, stomach bug  ---Drink plenty water and advance diet as tolerated  ---May add diluted Gatorade and/or Pedialyte  --- May use Tylenol and/or NSAIDs, like ibuprofen or naproxen as needed for pain/fever  ---Follow-up next week if symptoms are not improving and go to ER if condition worsens    ---Gastroenteritis education printed    ---Excuse given for today.  Patient also given excuse for Monday, if needed        Follow up and Dispositions:  Return if symptoms worsen or fail to improve.       Charolett Copes, APRN - NP

## 2023-12-02 ENCOUNTER — Encounter

## 2024-02-14 ENCOUNTER — Encounter

## 2024-03-31 ENCOUNTER — Ambulatory Visit
Admit: 2024-03-31 | Discharge: 2024-03-31 | Payer: BLUE CROSS/BLUE SHIELD | Attending: "Endocrinology | Primary: Student in an Organized Health Care Education/Training Program

## 2024-03-31 VITALS — BP 104/62 | HR 71 | Resp 17 | Wt 137.0 lb

## 2024-03-31 DIAGNOSIS — E0590 Thyrotoxicosis, unspecified without thyrotoxic crisis or storm: Principal | ICD-10-CM

## 2024-03-31 NOTE — Progress Notes (Unsigned)
 Camilo Blanch D.O.  [facility]   [address]  [place], [place]  [address]  Phone [phone]  Facsimile [phone]    History of Present Illness  The patient is a 48 year old female who presents for evaluation and management of Graves' hyperthyroidism. She was referred by Dr. Michael Colfax.    She was diagnosed with Graves' hyperthyroidism in 2021 while residing in [place]. The diagnosis was made based on blood work, which revealed elevated T3, TSH, and TSI levels. She did not undergo an uptake scan due to the COVID-19 pandemic. Her initial treatment regimen included methimazole  (2 pills daily) and propranolol for 3 months. The dosage of methimazole  was then reduced to 1 pill daily when she moved to [place] , and further decreased to 5 mg every other day upon her relocation to [place] . She discontinued the medication a year ago but resumed it under Dr. Marvell prescription. However, she ran out of the medication a month ago and has since experienced worsening symptoms, including tremors and blurry vision. She has not yet consulted an ophthalmologist.    Despite regular exercise, she has not noticed any weight loss. She occasionally experiences palpitations and increased anxiety. She also reports leg tremors, which have led her to stop using her Pel[employer] bike. She is considering thyroidectomy as a treatment option and is curious about the long-term use of Synthroid post-surgery. An ultrasound performed 2 years ago did not reveal any concerning findings.    Family history is notable for a sister who had hypothyroidism and goiter, which was poorly managed, leading to cirrhosis of the liver and subsequent death.    SOCIAL HISTORY  She works in [employer].    FAMILY HISTORY  Her sister had hypothyroidism and goiter, which led to hair loss and eventual death from cirrhosis of the liver.    ROS: As per HPI, all others negative  Results            Results  Labs:  TSI levels  were high.     Past Medical History:   Diagnosis Date    Graves disease     Hyperlipidemia        Past Surgical History:   Procedure Laterality Date    TOOTH EXTRACTION         Allergies:  Patient has no known allergies.    Medications:  Prior to Admission medications   Not on File      Reviewed in chart.    Social History     Socioeconomic History    Marital status: Married     Spouse name: Not on file    Number of children: Not on file    Years of education: Not on file    Highest education level: Not on file   Occupational History    Not on file   Tobacco Use    Smoking status: Never    Smokeless tobacco: Never   Vaping Use    Vaping status: Never Used   Substance and Sexual Activity    Alcohol use: Not Currently     Comment: Events ususally    Drug use: Never    Sexual activity: Yes     Partners: Male     Comment: Husband   Other Topics Concern    Not on file   Social History Narrative    Not on file     Social Drivers of Health     Financial Resource Strain: Not on file  Food Insecurity: Not on file   Transportation Needs: Not on file   Physical Activity: Not on file   Stress: Not on file   Social Connections: Not on file   Intimate Partner Violence: Not on file   Housing Stability: Not on file       Family History   Problem Relation Age of Onset    Cancer Mother 71 - 82        Eye cancer    Diabetes Mother     Osteoporosis Mother     Atrial Fibrillation Mother     Heart Disease Mother     High Blood Pressure Mother     High Cholesterol Mother     Stroke Mother     Diabetes type 2  Mother 23 - 63    Osteoporosis Maternal Grandmother         Deceased    High Blood Pressure Maternal Grandmother     High Cholesterol Maternal Grandmother     Alcohol Abuse Sister         Deceased- Cirrhosis of the liver    Hypothyroidism Sister 37 - 29    Alcohol Abuse Brother         Deceased - Cirrhosis of the liver    Diabetes type 2  Brother 40 - 49       Blood pressure 104/62, pulse 71, resp. rate 17, weight 62.1 kg (137 lb),  last menstrual period 03/11/2024, SpO2 98%.   GEN: alert and oriented, NAD, appropriate affect looks stated age  Physical Exam  General: Pleasant 48 year old female, no acute distress.  Head and Neck: Mild enlargement noted on the right side of the neck.      No results found for this or any previous visit (from the past 4 weeks).       Assessment & Plan  1. Graves' hyperthyroidism:  - Symptoms, including tremors and blurry vision, suggest a recurrence of Graves' hyperthyroidism.  - Mild enlargement on the right side of the thyroid gland detected during the physical exam.  - Methimazole  will be restarted. The patient previously took 5 mg every other day but ran out a month ago.  - A comprehensive set of labs will be ordered, including a repeat TSI level. An ultrasound of the right side of the neck will be ordered due to slight enlargement detected during the physical exam. A referral to Dr. Delores at [facility], an ophthalmologist specializing in [facility] eye disease, will be made.  [facility] Gavel' hyperthyroidism can recur after remission or persist mildly. 80% of patients do not go into remission, and some may need chronic methimazole . The paradigm is shifting to allow patients to choose between medication, radioactive iodine treatment, or surgery. If the thyroid is removed or treated with radioactive iodine, lifelong Synthroid will be necessary.    Follow-up: Labs to be completed as soon as possible. A virtual follow-up appointment is scheduled for 04/14/2024 to discuss lab results and adjust the medication regimen accordingly.     1. Hyperthyroidism    --Camilo JONELLE Blanch, DO on 03/31/2024 at 3:49 PM  No follow-ups on file.              The patient (or guardian, if applicable) and other individuals in attendance with the patient were advised that Artificial Intelligence will be utilized during this visit to record, process the conversation to generate a clinical note, and support improvement of the AI technology. The patient (or  guardian, if applicable) and other  individuals in attendance at the appointment consented to the use of AI, including the recording.

## 2024-04-02 ENCOUNTER — Inpatient Hospital Stay
Admit: 2024-04-02 | Discharge: 2024-04-02 | Payer: BLUE CROSS/BLUE SHIELD | Primary: Student in an Organized Health Care Education/Training Program

## 2024-04-02 DIAGNOSIS — E0590 Thyrotoxicosis, unspecified without thyrotoxic crisis or storm: Principal | ICD-10-CM

## 2024-04-02 LAB — CBC
Hematocrit: 34.3 % (ref 34.0–47.0)
Hemoglobin: 11.2 g/dL — ABNORMAL LOW (ref 11.5–15.7)
MCH: 29.9 pg (ref 27.0–34.5)
MCHC: 32.7 g/dL (ref 30.0–36.0)
MCV: 91.5 fL (ref 81.0–99.0)
MPV: 10.5 fL (ref 7.0–12.2)
Platelets: 237 x10e3/mcL (ref 140–440)
RBC: 3.75 x10e6/mcL (ref 3.60–5.20)
RDW: 13.7 % (ref 10.0–17.0)
WBC: 5.4 x10e3/mcL (ref 3.8–10.6)

## 2024-04-02 LAB — COMPREHENSIVE METABOLIC PANEL
ALT: 10 U/L (ref 0–42)
AST: 15 U/L (ref 0–46)
Albumin/Globulin Ratio: 1.67 (ref 1.00–2.70)
Albumin: 4 g/dL (ref 3.5–5.2)
Alk Phosphatase: 53 U/L (ref 35–117)
Anion Gap: 9 mmol/L (ref 2–17)
BUN: 12 mg/dL (ref 6–20)
CALCIUM,CORRECTED,CCA: 8.4 mg/dL — ABNORMAL LOW (ref 8.5–10.7)
CO2: 22 mmol/L (ref 22–29)
Calcium: 8.4 mg/dL — ABNORMAL LOW (ref 8.5–10.7)
Chloride: 107 mmol/L (ref 98–107)
Creatinine: 0.7 mg/dL (ref 0.5–1.0)
Est, Glom Filt Rate: 108 mL/min/1.73mÂ² (ref >=60)
Globulin: 2.4 g/dL (ref 1.9–4.4)
Glucose: 89 mg/dL (ref 70–99)
Osmolaliy Calculated: 274 mosm/kg (ref 270–287)
Potassium: 4.1 mmol/L (ref 3.5–5.3)
Sodium: 138 mmol/L (ref 135–145)
Total Bilirubin: 0.37 mg/dL (ref 0.00–1.20)
Total Protein: 6.4 g/dL (ref 5.7–8.3)

## 2024-04-02 LAB — T4, FREE: T4 Free: 1.11 ng/dL (ref 0.82–1.70)

## 2024-04-02 LAB — TSH: TSH, 3rd Generation: 1.36 u[IU]/mL (ref 0.358–3.740)

## 2024-04-04 LAB — THYROID PEROXIDASE ANTIBODY: Thyroid Peroxidase (TPO) Abs: 322 [IU]/mL — ABNORMAL HIGH (ref 0–34)

## 2024-04-05 LAB — T3: T3, Total: 91 ng/dL (ref 80–200)

## 2024-04-06 LAB — THYROID STIMULATING IMMUNOGLOBULIN: TSI: 0.11 IU/L (ref 0.00–0.55)

## 2024-04-09 ENCOUNTER — Inpatient Hospital Stay
Admit: 2024-04-09 | Payer: BLUE CROSS/BLUE SHIELD | Attending: "Endocrinology | Primary: Student in an Organized Health Care Education/Training Program

## 2024-04-09 DIAGNOSIS — E0789 Other specified disorders of thyroid: Principal | ICD-10-CM

## 2024-04-15 ENCOUNTER — Telehealth
Admit: 2024-04-15 | Discharge: 2024-04-15 | Payer: BLUE CROSS/BLUE SHIELD | Attending: "Endocrinology | Primary: Student in an Organized Health Care Education/Training Program

## 2024-04-15 DIAGNOSIS — E0590 Thyrotoxicosis, unspecified without thyrotoxic crisis or storm: Principal | ICD-10-CM

## 2024-04-18 NOTE — Telephone Encounter (Signed)
 FOLLOW UP APPOINTMENT SCHEDULED

## 2024-04-21 ENCOUNTER — Encounter
